# Patient Record
Sex: Male | Born: 1980 | Race: Black or African American | Hispanic: No | Marital: Single | Smoking: Current every day smoker
Health system: Southern US, Community
[De-identification: ages and names within clinical notes are randomized; demographics above are authoritative.]

---

## 2017-08-07 ENCOUNTER — Encounter (HOSPITAL_COMMUNITY): Payer: Self-pay

## 2017-08-07 DIAGNOSIS — N451 Epididymitis: Secondary | ICD-10-CM | POA: Insufficient documentation

## 2017-08-07 DIAGNOSIS — R310 Gross hematuria: Secondary | ICD-10-CM | POA: Insufficient documentation

## 2017-08-07 DIAGNOSIS — F17210 Nicotine dependence, cigarettes, uncomplicated: Secondary | ICD-10-CM | POA: Insufficient documentation

## 2017-08-07 NOTE — ED Triage Notes (Signed)
Onset last week left testicle swelling, that is worsening.  Pain at suprapubic area.  Pt seen small amount of blood in urine last week, none since.

## 2017-08-08 ENCOUNTER — Emergency Department (HOSPITAL_COMMUNITY)
Admission: EM | Admit: 2017-08-08 | Discharge: 2017-08-08 | Disposition: A | Discharge status: Home / Self Care | Payer: Self-pay | Attending: Emergency Medicine | Admitting: Emergency Medicine

## 2017-08-08 ENCOUNTER — Emergency Department (HOSPITAL_COMMUNITY): Payer: Self-pay

## 2017-08-08 DIAGNOSIS — N451 Epididymitis: Secondary | ICD-10-CM

## 2017-08-08 LAB — URINALYSIS, ROUTINE W REFLEX MICROSCOPIC
Glucose, UA: NEGATIVE mg/dL
KETONES UR: 5 mg/dL — AB
Nitrite: NEGATIVE
PROTEIN: 100 mg/dL — AB
SPECIFIC GRAVITY, URINE: 1.033 — AB (ref 1.005–1.030)
SQUAMOUS EPITHELIAL / LPF: NONE SEEN
pH: 5 (ref 5.0–8.0)

## 2017-08-08 MED ORDER — DOXYCYCLINE HYCLATE 100 MG PO CAPS: 100.0000 mg | ORAL_CAPSULE | Freq: Two times a day (BID) | ORAL | 0 refills | Status: AC

## 2017-08-08 MED ORDER — CEFTRIAXONE SODIUM 1 G IJ SOLR
1.0000 g | Freq: Once | INTRAMUSCULAR | Status: AC
Start: 2017-08-08 — End: 2017-08-08
  Administered 2017-08-08: 1 g via INTRAMUSCULAR
  Filled 2017-08-08: qty 10

## 2017-08-08 MED ORDER — HYDROCODONE-ACETAMINOPHEN 5-325 MG PO TABS: 1.0000 | ORAL_TABLET | ORAL | 0 refills | Status: AC | PRN

## 2017-08-08 MED ORDER — CEPHALEXIN 500 MG PO CAPS: 1000.0000 mg | ORAL_CAPSULE | Freq: Two times a day (BID) | ORAL | 0 refills | Status: AC

## 2017-08-08 MED ORDER — OXYCODONE-ACETAMINOPHEN 5-325 MG PO TABS
ORAL_TABLET | ORAL | Status: AC
  Filled 2017-08-08: qty 1

## 2017-08-08 MED ORDER — LIDOCAINE HCL (PF) 1 % IJ SOLN
INTRAMUSCULAR | Status: AC
  Administered 2017-08-08: 2 mL
  Filled 2017-08-08: qty 5

## 2017-08-08 MED ORDER — OXYCODONE-ACETAMINOPHEN 5-325 MG PO TABS
1.0000 | ORAL_TABLET | Freq: Once | ORAL | Status: AC
  Administered 2017-08-08: 1 via ORAL

## 2017-08-08 NOTE — ED Notes (Signed)
Pt ambulatory to room from lobby with steady gait noted

## 2017-08-08 NOTE — ED Notes (Signed)
Patient transported to Ultrasound 

## 2017-08-08 NOTE — ED Provider Notes (Signed)
[facility]-EMERGENCY DEPT Provider Note   CSN: [id number] Arrival date & time: 08/07/17  2058     History   Chief Complaint Chief Complaint  Patient presents with  . Testicle Pain  . Groin Swelling    HPI Angel Harris is a 36 y.o. male.  Patient presents to the emergency department for evaluation of left testicle pain. Patient reports that the pain began approximately 1 week ago. Initially it was a dull pain but since then has progressively worsened. Patient now complaining of moderate to severe, constant pain. He denies any injury. He did notice blood in his urine one time last week but not since. No dysuria. No back or flank pain. Patient does have some radiation of the pain up to the left side of the abdomen.      History reviewed. No pertinent past medical history.  There are no active problems to display for this patient.   History reviewed. No pertinent surgical history.     Home Medications    Prior to Admission medications   Medication Sig Start Date End Date Taking? Authorizing Provider  cephALEXin (KEFLEX) 500 MG capsule Take 2 capsules (1,000 mg total) by mouth 2 (two) times daily. 08/08/17   Gilda Crease, MD  doxycycline (VIBRAMYCIN) 100 MG capsule Take 1 capsule (100 mg total) by mouth 2 (two) times daily. 08/08/17   Gilda Crease, MD  HYDROcodone-acetaminophen (NORCO/VICODIN) 5-325 MG tablet Take 1 tablet by mouth every 4 (four) hours as needed for moderate pain. 08/08/17   Gilda Crease, MD    Family History History reviewed. No pertinent family history.  Social History Social History  Substance Use Topics  . Smoking status: Current Every Day Smoker    Packs/day: 0.50    Types: Cigarettes  . Smokeless tobacco: Never Used  . Alcohol use Yes     Comment: occ     Allergies   Asa [aspirin]   Review of Systems Review of Systems  Genitourinary: Positive for hematuria and testicular pain.  All other systems reviewed and  are negative.    Physical Exam Updated Vital Signs BP 116/88   Pulse (!) 49   Temp 98.2 F (36.8 C) (Oral)   Resp 16   SpO2 97%   Physical Exam  Constitutional: He is oriented to person, place, and time. He appears well-developed and well-nourished. No distress.  HENT:  Head: Normocephalic and atraumatic.  Right Ear: Hearing normal.  Left Ear: Hearing normal.  Nose: Nose normal.  Mouth/Throat: Oropharynx is clear and moist and mucous membranes are normal.  Eyes: Pupils are equal, round, and reactive to light. Conjunctivae and EOM are normal.  Neck: Normal range of motion. Neck supple.  Cardiovascular: Regular rhythm, S1 normal and S2 normal.  Exam reveals no gallop and no friction rub.   No murmur heard. Pulmonary/Chest: Effort normal and breath sounds normal. No respiratory distress. He exhibits no tenderness.  Abdominal: Soft. Normal appearance and bowel sounds are normal. There is no hepatosplenomegaly. There is no tenderness. There is no rebound, no guarding, no tenderness at McBurney's point and negative Murphy's sign. No hernia. Hernia confirmed negative in the left inguinal area.  Genitourinary: Right testis shows no mass and no tenderness. Left testis shows tenderness. Left testis shows no mass and no swelling.  Musculoskeletal: Normal range of motion.  Neurological: He is alert and oriented to person, place, and time. He has normal strength. No cranial nerve deficit or sensory deficit. Coordination normal. GCS eye subscore is  4. GCS verbal subscore is 5. GCS motor subscore is 6.  Skin: Skin is warm, dry and intact. No rash noted. No cyanosis.  Psychiatric: He has a normal mood and affect. His speech is normal and behavior is normal. Thought content normal.  Nursing note and vitals reviewed.    ED Treatments / Results  Labs (all labs ordered are listed, but only abnormal results are displayed) Labs Reviewed  URINALYSIS, ROUTINE W REFLEX MICROSCOPIC - Abnormal; Notable  for the following:       Result Value   Color, Urine AMBER (*)    APPearance CLOUDY (*)    Specific Gravity, Urine 1.033 (*)    Hgb urine dipstick SMALL (*)    Bilirubin Urine SMALL (*)    Ketones, ur 5 (*)    Protein, ur 100 (*)    Leukocytes, UA LARGE (*)    Bacteria, UA RARE (*)    All other components within normal limits    EKG  EKG Interpretation None       Radiology US Scrotum  Result Date: 08/08/2017 CLINICAL DATA:  Testicular pain EXAM: SCROTAL ULTRASOUND DOPPLER ULTRASOUND OF THE TESTICLES TECHNIQUE: Complete ultrasound examination of the testicles, epididymis, and other scrotal structures was performed. Color and spectral Doppler ultrasound were also utilized to evaluate blood flow to the testicles. COMPARISON:  None. FINDINGS: Right testicle Measurements: 4.9 x 2.7 x 3.2 cm. No mass or microlithiasis visualized. Left testicle Measurements: 3.6 x 2.1 x 1.9 cm. No mass or microlithiasis visualized. Right epididymis:  Normal in size and appearance. Left epididymis: Enlarged, measuring 3.3 x 1.1 x 2.0 cm. Hyperemia. Hydrocele:  Small right hydrocele.  Complex, large left hydrocele. Varicocele:  None visualized. Pulsed Doppler interrogation of both testes demonstrates normal low resistance arterial and venous waveforms bilaterally. IMPRESSION: 1. Enlarged, hyperemic left epididymis with complex large left hydrocele suggestive of epididymitis. 2. No testicular torsion. Normal arterial and venous Doppler interrogation. Electronically Signed   By: Deatra Robinson M.D.   On: 08/08/2017 04:19   [place] Art/ven Flow Abd Pelv Doppler  Result Date: 08/08/2017 CLINICAL DATA:  Testicular pain EXAM: SCROTAL ULTRASOUND DOPPLER ULTRASOUND OF THE TESTICLES TECHNIQUE: Complete ultrasound examination of the testicles, epididymis, and other scrotal structures was performed. Color and spectral Doppler ultrasound were also utilized to evaluate blood flow to the testicles. COMPARISON:  None. FINDINGS: Right  testicle Measurements: 4.9 x 2.7 x 3.2 cm. No mass or microlithiasis visualized. Left testicle Measurements: 3.6 x 2.1 x 1.9 cm. No mass or microlithiasis visualized. Right epididymis:  Normal in size and appearance. Left epididymis: Enlarged, measuring 3.3 x 1.1 x 2.0 cm. Hyperemia. Hydrocele:  Small right hydrocele.  Complex, large left hydrocele. Varicocele:  None visualized. Pulsed Doppler interrogation of both testes demonstrates normal low resistance arterial and venous waveforms bilaterally. IMPRESSION: 1. Enlarged, hyperemic left epididymis with complex large left hydrocele suggestive of epididymitis. 2. No testicular torsion. Normal arterial and venous Doppler interrogation. Electronically Signed   By: Deatra Robinson M.D.   On: 08/08/2017 04:19    Procedures Procedures (including critical care time)  Medications Ordered in ED Medications  oxyCODONE-acetaminophen (PERCOCET/ROXICET) 5-325 MG per tablet (not administered)  oxyCODONE-acetaminophen (PERCOCET/ROXICET) 5-325 MG per tablet 1 tablet (1 tablet Oral Given 08/08/17 0213)  cefTRIAXone (ROCEPHIN) injection 1 g (1 g Intramuscular Given 08/08/17 0533)  lidocaine (PF) (XYLOCAINE) 1 % injection (2 mLs  Given 08/08/17 0536)     Initial Impression / Assessment and Plan / ED Course  I have reviewed  the triage vital signs and the nursing notes.  Pertinent labs & imaging results that were available during my care of the patient were reviewed by me and considered in my medical decision making (see chart for details).     Patient presents to the emergency department with a one-week history of progressively worsening left testicular pain. Urinalysis does appear to be infected. Ultrasound confirms epididymitis without torsion.Will be treatedfor GC, chlamydia, urinary pathogens, follow-up with urology.  Final Clinical Impressions(s) / ED Diagnoses   Final diagnoses:  Epididymitis    New Prescriptions New Prescriptions   CEPHALEXIN (KEFLEX)  500 MG CAPSULE    Take 2 capsules (1,000 mg total) by mouth 2 (two) times daily.   DOXYCYCLINE (VIBRAMYCIN) 100 MG CAPSULE    Take 1 capsule (100 mg total) by mouth 2 (two) times daily.   HYDROCODONE-ACETAMINOPHEN (NORCO/VICODIN) 5-325 MG TABLET    Take 1 tablet by mouth every 4 (four) hours as needed for moderate pain.     Gilda Crease, MD 08/08/17 [phone]

## 2019-06-22 IMAGING — US US SCROTUM
1 series · 14 of 25 positions shown · non-contrast
Comparison: None.

CLINICAL DATA: Testicular pain

EXAM:
SCROTAL ULTRASOUND
DOPPLER ULTRASOUND OF THE TESTICLES
TECHNIQUE: Complete ultrasound examination of the testicles, epididymis, and
other scrotal structures was performed. Color and spectral Doppler
ultrasound were also utilized to evaluate blood flow to the
testicles.

[Series 1: us scrotum · 0.07mm/px · 65 acquisitions, 14 frames shown]
[im 1/65]
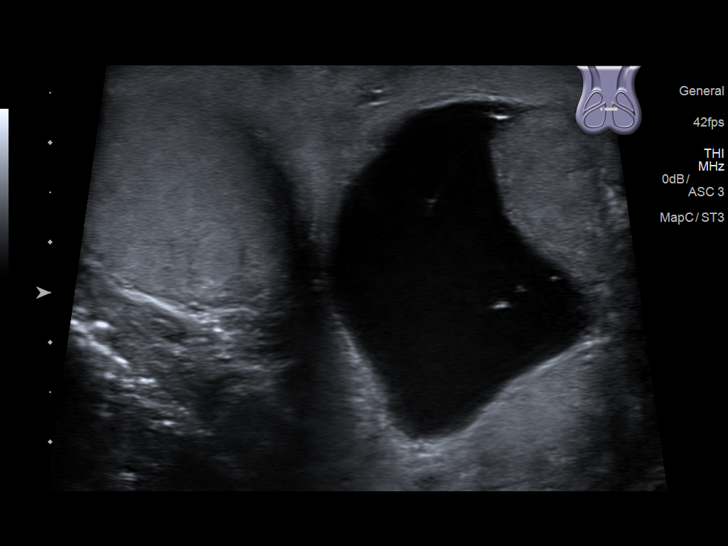
[im 6/65]
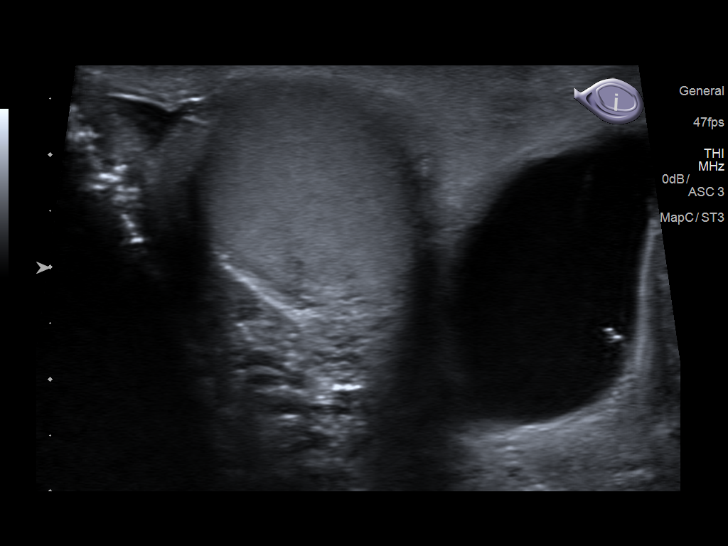
[im 11/65]
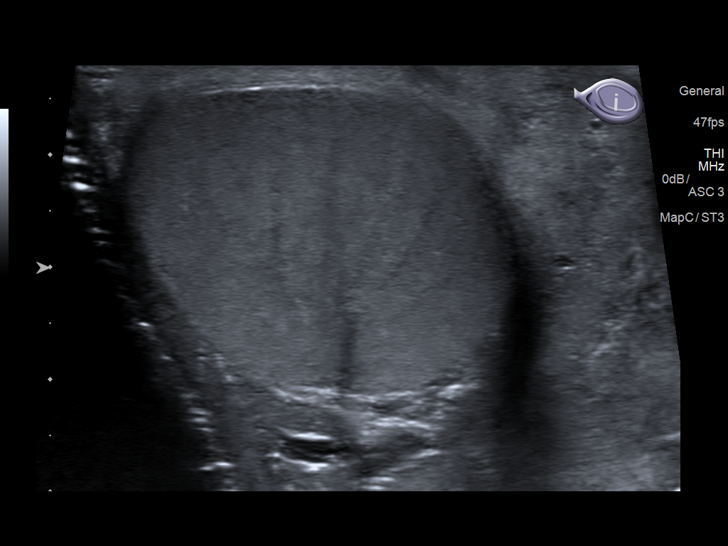
[im 17/65]
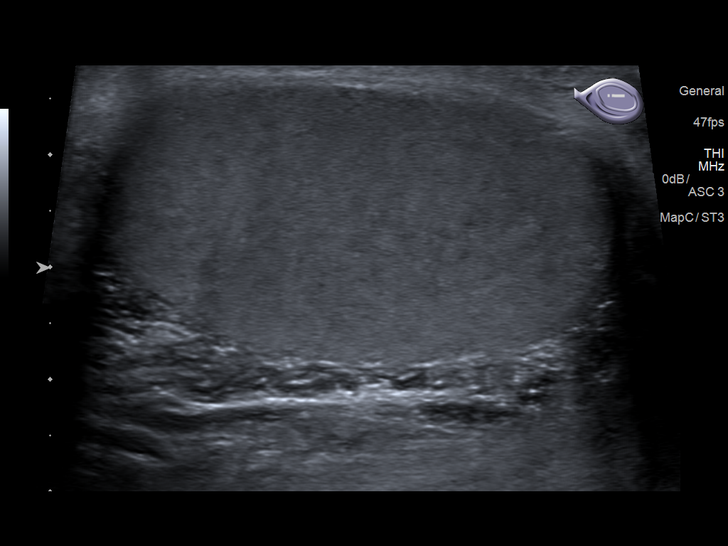
[im 22/65]
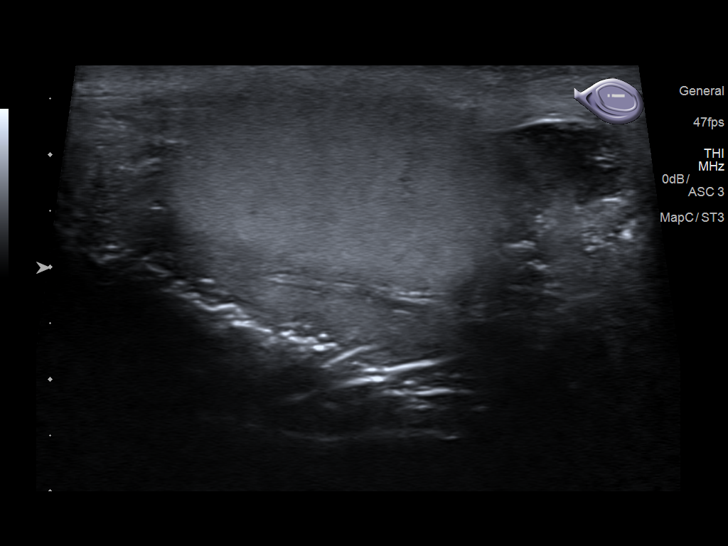
[im 25/65]
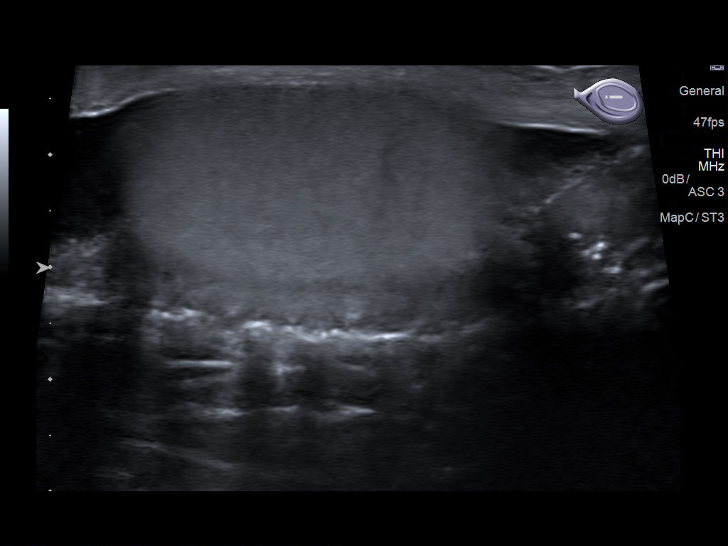
[im 30/65]
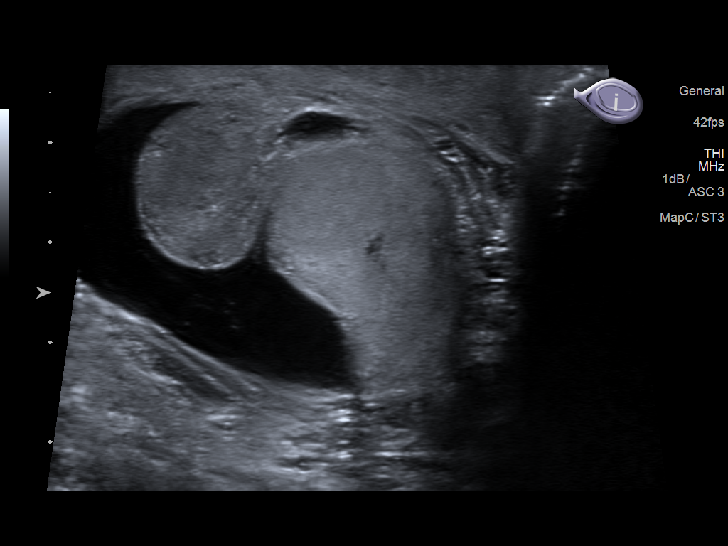
[im 35/65]
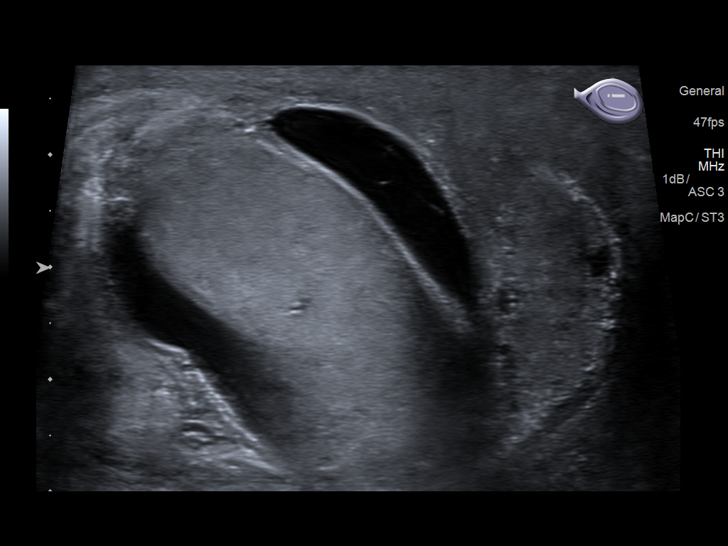
[im 41/65]
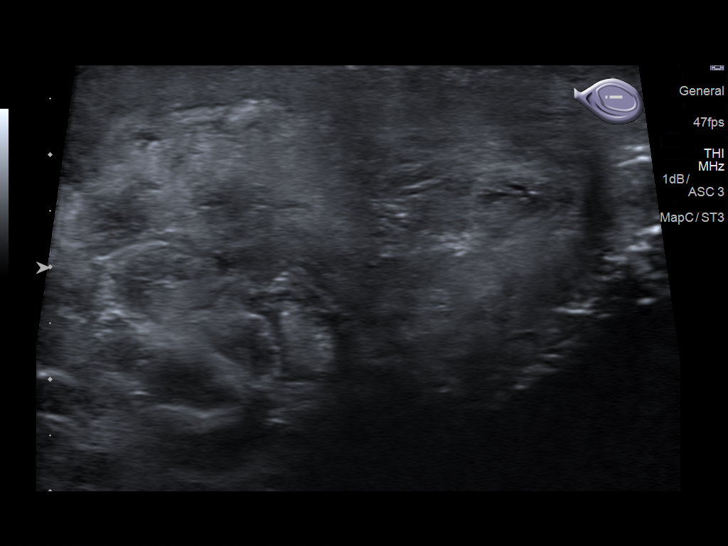
[im 43/65]
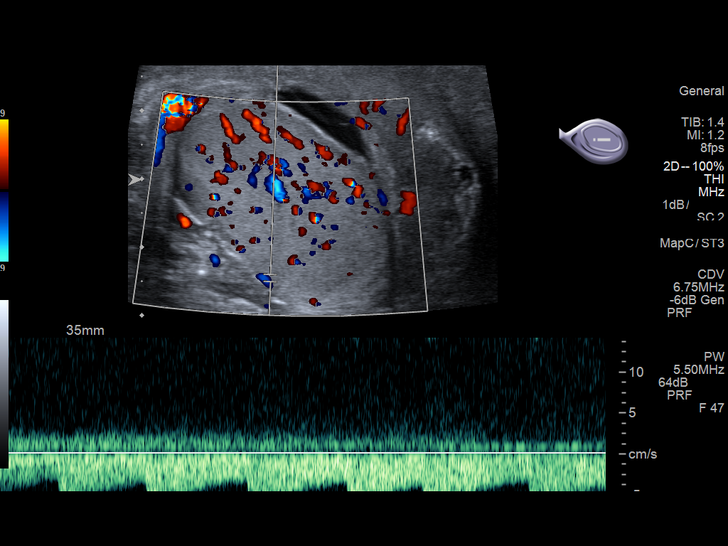
[im 49/65]
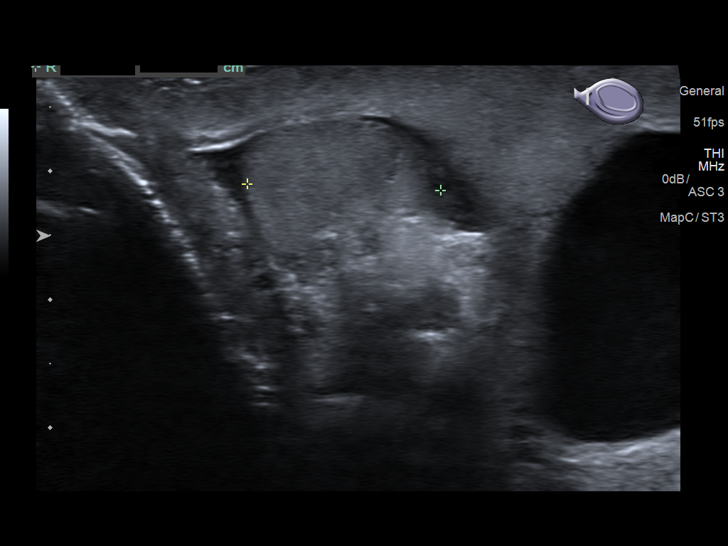
[im 54/65]
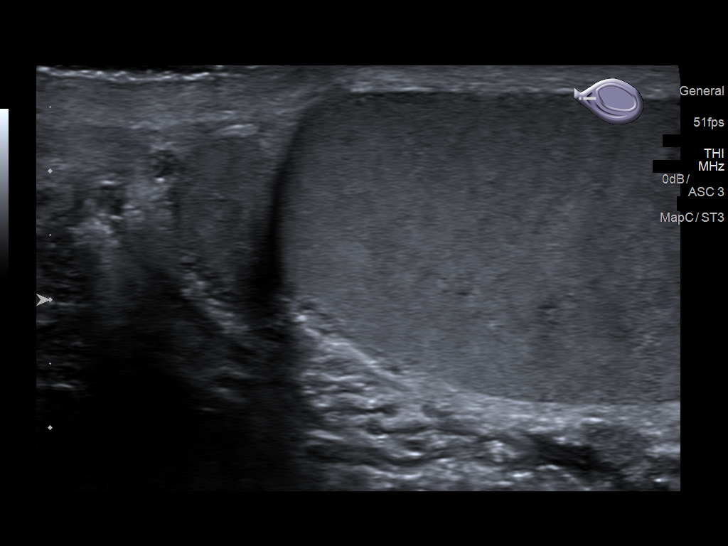
[im 59/65]
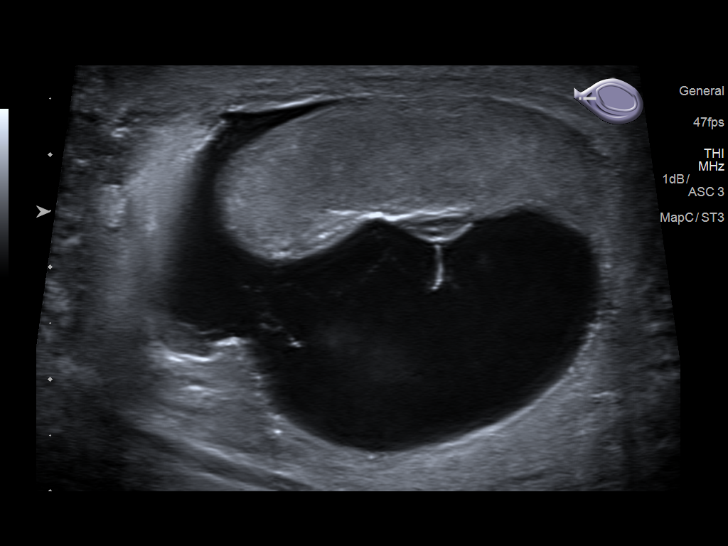
[im 65/65]
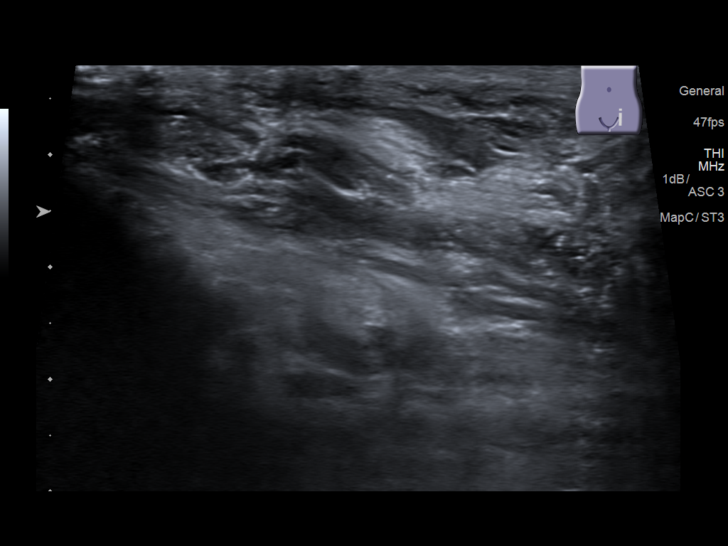

[14 of 25 positions shown; findings below may reference images not displayed]

FINDINGS: Right testicle

Measurements: 4.9 x 2.7 x 3.2 cm. No mass or microlithiasis
visualized.

Left testicle

Measurements: 3.6 x 2.1 x 1.9 cm. No mass or microlithiasis
visualized.

Right epididymis:  Normal in size and appearance.

Left epididymis: Enlarged, measuring 3.3 x 1.1 x 2.0 cm. Hyperemia.

Hydrocele:  Small right hydrocele.  Complex, large left hydrocele.

Varicocele:  None visualized.

Pulsed Doppler interrogation of both testes demonstrates normal low
resistance arterial and venous waveforms bilaterally.
IMPRESSION: 1. Enlarged, hyperemic left epididymis with complex large left
hydrocele suggestive of epididymitis.
2. No testicular torsion. Normal arterial and venous Doppler
interrogation.
